# Patient Record
Sex: Female | Born: 2000 | Race: Black or African American | Hispanic: No | Marital: Single | Smoking: Never smoker
Health system: Southern US, Community
[De-identification: ages and names within clinical notes are randomized; demographics above are authoritative.]

## PROBLEM LIST (undated history)

## (undated) DIAGNOSIS — D649 Anemia, unspecified: Secondary | ICD-10-CM

## (undated) DIAGNOSIS — L309 Dermatitis, unspecified: Secondary | ICD-10-CM

## (undated) DIAGNOSIS — J302 Other seasonal allergic rhinitis: Secondary | ICD-10-CM

## (undated) DIAGNOSIS — Z8489 Family history of other specified conditions: Secondary | ICD-10-CM

---
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
## 2020-07-01 IMAGING — US US BREAST*R* LIMITED INC AXILLA
1 series · 4 of 4 positions shown · non-contrast
Comparison: Previous exam(s).

CLINICAL DATA: Follow-up for probably benign fibroadenoma in the
RIGHT breast.

EXAM:
ULTRASOUND OF THE RIGHT BREAST

[Series 1: us breast*right* limited inc axilla · 0.05mm/px · 4 of 4 slices shown]
[im 1/4]
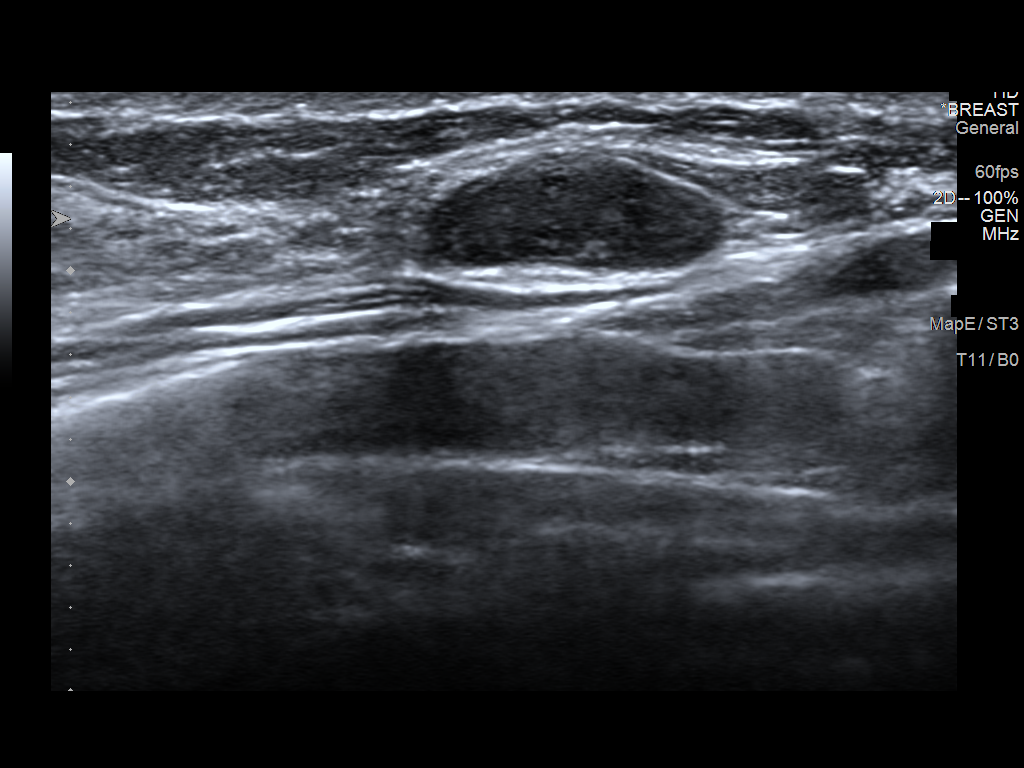
[im 2/4]
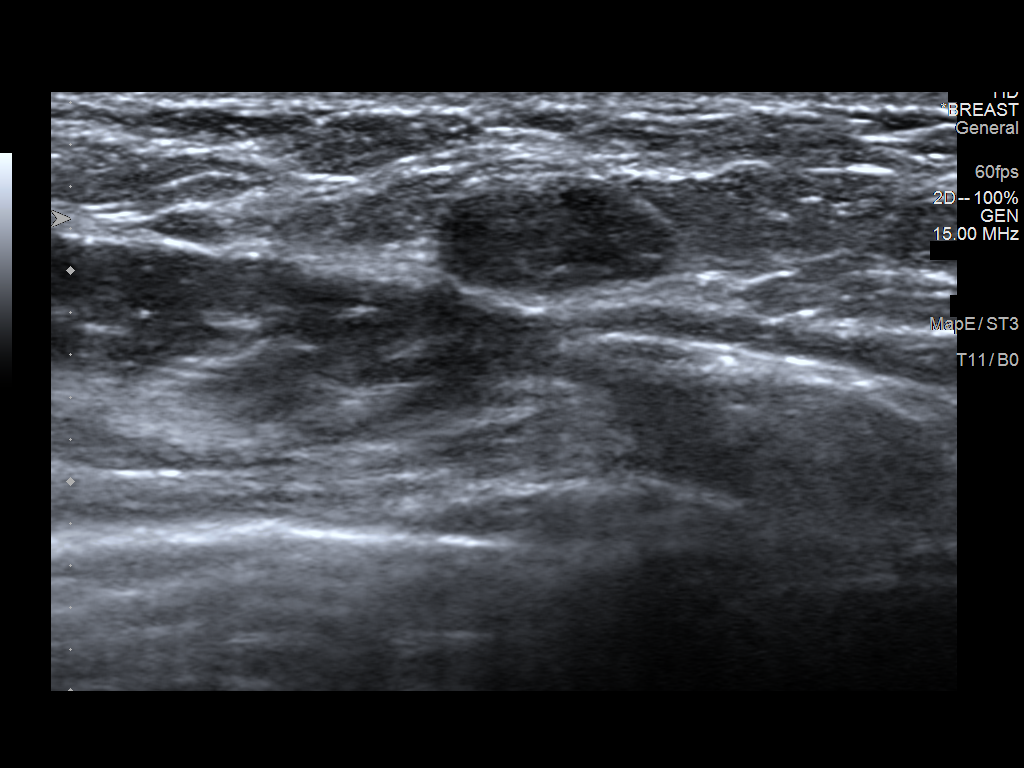
[im 3/4]
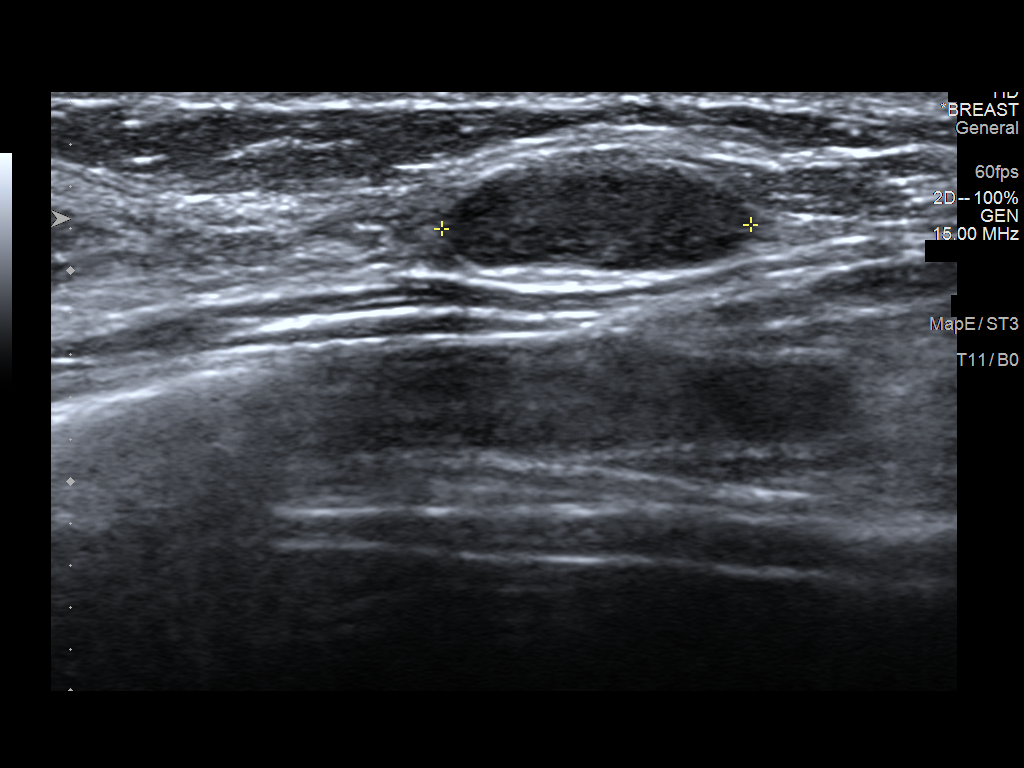
[im 4/4]
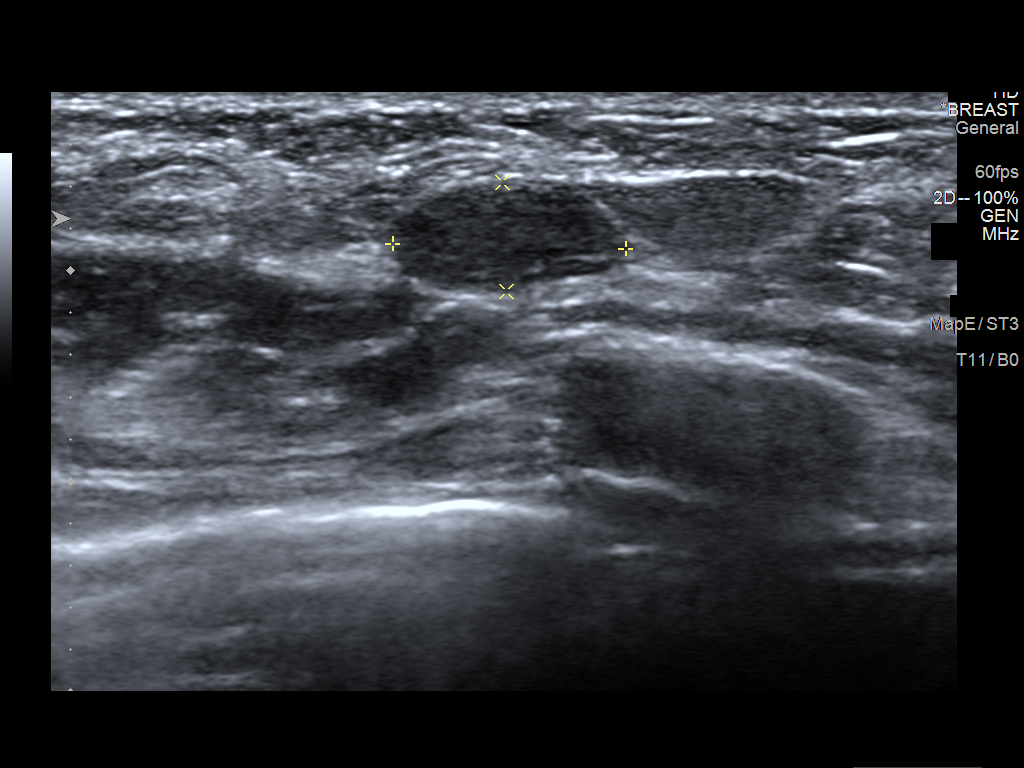

[4 of 4 positions shown; findings below may reference images not displayed]

FINDINGS: Targeted ultrasound is performed, again showing the oval
circumscribed hypoechoic mass in the RIGHT breast at the 3:30
o'clock axis, 7 cm from the nipple, measuring 1.5 x 0.5 x 1.1 cm,
stable or slightly smaller compared to previous exams, suggesting
benignity, most likely a benign fibroadenoma.
IMPRESSION: Probably benign fibroadenoma within the RIGHT breast at the 3:30
o'clock axis, 7 cm from the nipple, measuring 1.5 x 0.5 x 1.1 cm on
today's exam, stable or slightly smaller compared to previous exams
suggesting benignity.

Recommend additional follow-up RIGHT breast ultrasound in 12 months
to ensure 2 year stability.

RECOMMENDATION:
RIGHT breast ultrasound in 12 months.

I have discussed the findings and recommendations with the patient.
If applicable, a reminder letter will be sent to the patient
regarding the next appointment.

BI-RADS CATEGORY  3: Probably benign.

## 2022-12-18 ENCOUNTER — Ambulatory Visit (HOSPITAL_COMMUNITY): Payer: Self-pay

## 2023-01-12 ENCOUNTER — Ambulatory Visit (HOSPITAL_COMMUNITY): Payer: Self-pay

## 2023-04-01 ENCOUNTER — Emergency Department (HOSPITAL_COMMUNITY): Payer: Medicaid Other

## 2023-04-01 ENCOUNTER — Encounter (HOSPITAL_COMMUNITY): Payer: Self-pay | Admitting: Emergency Medicine

## 2023-04-01 ENCOUNTER — Other Ambulatory Visit: Payer: Self-pay

## 2023-04-01 ENCOUNTER — Observation Stay (HOSPITAL_COMMUNITY)
Admission: EM | Admit: 2023-04-01 | Discharge: 2023-04-03 | DRG: 200 | Disposition: A | Discharge status: Home Health | Payer: Medicaid Other | Attending: General Surgery | Admitting: General Surgery

## 2023-04-01 DIAGNOSIS — Y92410 Unspecified street and highway as the place of occurrence of the external cause: Secondary | ICD-10-CM | POA: Diagnosis not present

## 2023-04-01 DIAGNOSIS — R000 Tachycardia, unspecified: Secondary | ICD-10-CM | POA: Insufficient documentation

## 2023-04-01 DIAGNOSIS — D509 Iron deficiency anemia, unspecified: Secondary | ICD-10-CM | POA: Insufficient documentation

## 2023-04-01 DIAGNOSIS — S27322A Contusion of lung, bilateral, initial encounter: Secondary | ICD-10-CM | POA: Insufficient documentation

## 2023-04-01 DIAGNOSIS — S270XXA Traumatic pneumothorax, initial encounter: Secondary | ICD-10-CM | POA: Diagnosis not present

## 2023-04-01 DIAGNOSIS — S82899A Other fracture of unspecified lower leg, initial encounter for closed fracture: Secondary | ICD-10-CM | POA: Diagnosis present

## 2023-04-01 DIAGNOSIS — Y906 Blood alcohol level of 120-199 mg/100 ml: Secondary | ICD-10-CM | POA: Insufficient documentation

## 2023-04-01 DIAGNOSIS — N920 Excessive and frequent menstruation with regular cycle: Secondary | ICD-10-CM | POA: Diagnosis not present

## 2023-04-01 DIAGNOSIS — L309 Dermatitis, unspecified: Secondary | ICD-10-CM | POA: Insufficient documentation

## 2023-04-01 DIAGNOSIS — F10120 Alcohol abuse with intoxication, uncomplicated: Secondary | ICD-10-CM | POA: Diagnosis not present

## 2023-04-01 DIAGNOSIS — S92001A Unspecified fracture of right calcaneus, initial encounter for closed fracture: Secondary | ICD-10-CM | POA: Diagnosis not present

## 2023-04-01 DIAGNOSIS — S82841A Displaced bimalleolar fracture of right lower leg, initial encounter for closed fracture: Secondary | ICD-10-CM | POA: Insufficient documentation

## 2023-04-01 DIAGNOSIS — F10920 Alcohol use, unspecified with intoxication, uncomplicated: Secondary | ICD-10-CM

## 2023-04-01 LAB — CBC WITH DIFFERENTIAL/PLATELET
Abs Immature Granulocytes: 0.03 10*3/uL (ref 0.00–0.07)
Basophils Absolute: 0.1 10*3/uL (ref 0.0–0.1)
Basophils Relative: 1 %
Eosinophils Absolute: 0 10*3/uL (ref 0.0–0.5)
Eosinophils Relative: 0 %
HCT: 27.6 % — ABNORMAL LOW (ref 36.0–46.0)
Hemoglobin: 7.2 g/dL — ABNORMAL LOW (ref 12.0–15.0)
Immature Granulocytes: 0 %
Lymphocytes Relative: 16 %
Lymphs Abs: 1.3 10*3/uL (ref 0.7–4.0)
MCH: 16.6 pg — ABNORMAL LOW (ref 26.0–34.0)
MCHC: 26.1 g/dL — ABNORMAL LOW (ref 30.0–36.0)
MCV: 63.4 fL — ABNORMAL LOW (ref 80.0–100.0)
Monocytes Absolute: 0.5 10*3/uL (ref 0.1–1.0)
Monocytes Relative: 6 %
Neutro Abs: 6.4 10*3/uL (ref 1.7–7.7)
Neutrophils Relative %: 77 %
Platelets: 490 10*3/uL — ABNORMAL HIGH (ref 150–400)
RBC: 4.35 MIL/uL (ref 3.87–5.11)
RDW: 18.5 % — ABNORMAL HIGH (ref 11.5–15.5)
WBC: 8.3 10*3/uL (ref 4.0–10.5)
nRBC: 0 % (ref 0.0–0.2)

## 2023-04-01 LAB — BASIC METABOLIC PANEL
Anion gap: 10 (ref 5–15)
BUN: 7 mg/dL (ref 6–20)
CO2: 20 mmol/L — ABNORMAL LOW (ref 22–32)
Calcium: 8.2 mg/dL — ABNORMAL LOW (ref 8.9–10.3)
Chloride: 109 mmol/L (ref 98–111)
Creatinine, Ser: 0.68 mg/dL (ref 0.44–1.00)
GFR, Estimated: >60 mL/min (ref >60)
Glucose, Bld: 144 mg/dL — ABNORMAL HIGH (ref 70–99)
Potassium: 3.4 mmol/L — ABNORMAL LOW (ref 3.5–5.1)
Sodium: 139 mmol/L (ref 135–145)

## 2023-04-01 LAB — HIV ANTIBODY (ROUTINE TESTING W REFLEX): HIV Screen 4th Generation wRfx: NONREACTIVE

## 2023-04-01 LAB — HCG, SERUM, QUALITATIVE: Preg, Serum: NEGATIVE

## 2023-04-01 LAB — ETHANOL: Alcohol, Ethyl (B): 125 mg/dL — ABNORMAL HIGH (ref <10)

## 2023-04-01 MED ORDER — MORPHINE SULFATE (PF) 4 MG/ML IV SOLN
4.0000 mg | INTRAVENOUS | Status: DC | PRN
  Administered 2023-04-02: 4 mg via INTRAVENOUS
  Filled 2023-04-01: qty 1

## 2023-04-01 MED ORDER — HYDRALAZINE HCL 20 MG/ML IJ SOLN: 10.0000 mg | INTRAMUSCULAR | Status: DC | PRN

## 2023-04-01 MED ORDER — OXYCODONE-ACETAMINOPHEN 5-325 MG PO TABS
2.0000 | ORAL_TABLET | Freq: Once | ORAL | Status: AC
  Administered 2023-04-01: 2 via ORAL
  Filled 2023-04-01: qty 2

## 2023-04-01 MED ORDER — ONDANSETRON 4 MG PO TBDP: 4.0000 mg | ORAL_TABLET | Freq: Four times a day (QID) | ORAL | Status: DC | PRN

## 2023-04-01 MED ORDER — ONDANSETRON HCL 4 MG/2ML IJ SOLN
4.0000 mg | Freq: Four times a day (QID) | INTRAMUSCULAR | Status: DC | PRN
  Administered 2023-04-03: 4 mg via INTRAVENOUS
  Filled 2023-04-01: qty 2

## 2023-04-01 MED ORDER — OXYCODONE HCL 5 MG PO TABS
10.0000 mg | ORAL_TABLET | ORAL | Status: DC | PRN
  Administered 2023-04-01 – 2023-04-02 (×2): 10 mg via ORAL
  Filled 2023-04-01 (×2): qty 2

## 2023-04-01 MED ORDER — HYDROMORPHONE HCL 1 MG/ML IJ SOLN
0.5000 mg | Freq: Once | INTRAMUSCULAR | Status: AC
  Administered 2023-04-01: 0.5 mg via INTRAVENOUS
  Filled 2023-04-01: qty 1

## 2023-04-01 MED ORDER — OXYCODONE HCL 5 MG PO TABS
5.0000 mg | ORAL_TABLET | ORAL | Status: DC | PRN
  Administered 2023-04-01: 5 mg via ORAL
  Filled 2023-04-01 (×2): qty 1

## 2023-04-01 MED ORDER — DOCUSATE SODIUM 100 MG PO CAPS
100.0000 mg | ORAL_CAPSULE | Freq: Two times a day (BID) | ORAL | Status: DC
  Administered 2023-04-01 – 2023-04-03 (×4): 100 mg via ORAL
  Filled 2023-04-01 (×5): qty 1

## 2023-04-01 MED ORDER — POLYETHYLENE GLYCOL 3350 17 G PO PACK: 17.0000 g | PACK | Freq: Every day | ORAL | Status: DC | PRN

## 2023-04-01 MED ORDER — METHOCARBAMOL 500 MG PO TABS
500.0000 mg | ORAL_TABLET | Freq: Three times a day (TID) | ORAL | Status: DC
  Administered 2023-04-01 – 2023-04-03 (×8): 500 mg via ORAL
  Filled 2023-04-01 (×8): qty 1

## 2023-04-01 MED ORDER — IOHEXOL 350 MG/ML SOLN
75.0000 mL | Freq: Once | INTRAVENOUS | Status: AC | PRN
  Administered 2023-04-01: 75 mL via INTRAVENOUS

## 2023-04-01 MED ORDER — LACTATED RINGERS IV SOLN: INTRAVENOUS | Status: DC

## 2023-04-01 MED ORDER — METOPROLOL TARTRATE 5 MG/5ML IV SOLN: 5.0000 mg | Freq: Four times a day (QID) | INTRAVENOUS | Status: DC | PRN

## 2023-04-01 MED ORDER — ACETAMINOPHEN 500 MG PO TABS
1000.0000 mg | ORAL_TABLET | Freq: Four times a day (QID) | ORAL | Status: DC
  Administered 2023-04-01 – 2023-04-03 (×8): 1000 mg via ORAL
  Filled 2023-04-01 (×11): qty 2

## 2023-04-01 MED ORDER — ENOXAPARIN SODIUM 30 MG/0.3ML IJ SOSY
30.0000 mg | PREFILLED_SYRINGE | Freq: Two times a day (BID) | INTRAMUSCULAR | Status: DC
  Administered 2023-04-02: 30 mg via SUBCUTANEOUS
  Filled 2023-04-01: qty 0.3

## 2023-04-01 MED ORDER — KETOROLAC TROMETHAMINE 15 MG/ML IJ SOLN
15.0000 mg | Freq: Once | INTRAMUSCULAR | Status: AC
  Administered 2023-04-01: 15 mg via INTRAVENOUS
  Filled 2023-04-01: qty 1

## 2023-04-01 MED ORDER — METHOCARBAMOL 1000 MG/10ML IJ SOLN
500.0000 mg | Freq: Three times a day (TID) | INTRAVENOUS | Status: DC
  Filled 2023-04-01: qty 5
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
## 2023-04-01 NOTE — Progress Notes (Signed)
Orthopedic Tech Progress Note Patient Details:  Pamela Irwin Aug 06, 2001 [id number]  Ortho Devices Type of Ortho Device: Crutches Ortho Device/Splint Location: rle bulky jones dressing Ortho Device/Splint Interventions: Ordered, Adjustment   Post Interventions Patient Tolerated: Well Instructions Provided: Care of device, Adjustment of device Crutches left at bedside, as patient has not yet received PT evaluation yet. [place] A Ames Hoban 04/01/2023, 1:12 PM

## 2023-04-01 NOTE — Progress Notes (Signed)
Orthopedic Tech Progress Note Patient Details:  Pamela Irwin 2001-03-26 [id number]  Ortho Devices Type of Ortho Device: Stirrup splint Ortho Device/Splint Location: rle bulky jones dressing Ortho Device/Splint Interventions: Ordered, Application, Adjustment  I applied a short leg bulky jones with a stirrup on top. The patients RN held for me. Post Interventions Patient Tolerated: Well Instructions Provided: Care of device, Adjustment of device  Trinna Post 04/01/2023, 5:45 AM
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
## 2023-04-01 NOTE — Plan of Care (Signed)

## 2023-04-02 ENCOUNTER — Observation Stay (HOSPITAL_COMMUNITY): Payer: Medicaid Other

## 2023-04-02 LAB — CBC
HCT: 21.9 % — ABNORMAL LOW (ref 36.0–46.0)
Hemoglobin: 6.2 g/dL — CL (ref 12.0–15.0)
MCH: 18.1 pg — ABNORMAL LOW (ref 26.0–34.0)
MCHC: 28.3 g/dL — ABNORMAL LOW (ref 30.0–36.0)
MCV: 64 fL — ABNORMAL LOW (ref 80.0–100.0)
Platelets: 345 10*3/uL (ref 150–400)
RBC: 3.42 MIL/uL — ABNORMAL LOW (ref 3.87–5.11)
RDW: 18 % — ABNORMAL HIGH (ref 11.5–15.5)
WBC: 8.4 10*3/uL (ref 4.0–10.5)
nRBC: 0 % (ref 0.0–0.2)

## 2023-04-02 LAB — HEMOGLOBIN AND HEMATOCRIT, BLOOD
HCT: 26.3 % — ABNORMAL LOW (ref 36.0–46.0)
Hemoglobin: 7.4 g/dL — ABNORMAL LOW (ref 12.0–15.0)

## 2023-04-02 LAB — BASIC METABOLIC PANEL
Anion gap: 9 (ref 5–15)
BUN: 6 mg/dL (ref 6–20)
CO2: 25 mmol/L (ref 22–32)
Calcium: 8.7 mg/dL — ABNORMAL LOW (ref 8.9–10.3)
Chloride: 103 mmol/L (ref 98–111)
Creatinine, Ser: 0.76 mg/dL (ref 0.44–1.00)
GFR, Estimated: >60 mL/min (ref >60)
Glucose, Bld: 90 mg/dL (ref 70–99)
Potassium: 3.3 mmol/L — ABNORMAL LOW (ref 3.5–5.1)
Sodium: 137 mmol/L (ref 135–145)

## 2023-04-02 LAB — ABO/RH: ABO/RH(D): O POS

## 2023-04-02 LAB — PREPARE RBC (CROSSMATCH)

## 2023-04-02 LAB — TYPE AND SCREEN
Antibody Screen: NEGATIVE
Unit division: 0

## 2023-04-02 LAB — BPAM RBC: Unit Type and Rh: 5100

## 2023-04-02 MED ORDER — VITAMIN C 500 MG PO TABS
500.0000 mg | ORAL_TABLET | Freq: Two times a day (BID) | ORAL | Status: DC
  Administered 2023-04-02 – 2023-04-03 (×3): 500 mg via ORAL
  Filled 2023-04-02 (×3): qty 1

## 2023-04-02 MED ORDER — POTASSIUM CHLORIDE 20 MEQ PO PACK
40.0000 meq | PACK | Freq: Once | ORAL | Status: AC
  Administered 2023-04-02: 40 meq via ORAL
  Filled 2023-04-02: qty 2

## 2023-04-02 MED ORDER — DIPHENHYDRAMINE HCL 25 MG PO CAPS
25.0000 mg | ORAL_CAPSULE | Freq: Four times a day (QID) | ORAL | Status: DC | PRN
  Administered 2023-04-02: 25 mg via ORAL
  Filled 2023-04-02 (×2): qty 1

## 2023-04-02 MED ORDER — OXYCODONE HCL 5 MG PO TABS
5.0000 mg | ORAL_TABLET | ORAL | Status: DC | PRN
  Administered 2023-04-02 – 2023-04-03 (×2): 10 mg via ORAL
  Filled 2023-04-02 (×2): qty 2

## 2023-04-02 MED ORDER — FERROUS SULFATE 325 (65 FE) MG PO TABS
325.0000 mg | ORAL_TABLET | Freq: Every day | ORAL | Status: DC
  Administered 2023-04-03: 325 mg via ORAL
  Filled 2023-04-02: qty 1

## 2023-04-02 MED ORDER — SODIUM CHLORIDE 0.9% IV SOLUTION: Freq: Once | INTRAVENOUS | Status: AC

## 2023-04-02 NOTE — Plan of Care (Signed)
  Problem: Education: Goal: Knowledge of General Education information will improve Description: Including pain rating scale, medication(s)/side effects and non-pharmacologic comfort measures Outcome: Progressing   Problem: Clinical Measurements: Goal: Will remain free from infection Outcome: Progressing   Problem: Activity: Goal: Risk for activity intolerance will decrease Outcome: Progressing   Problem: Nutrition: Goal: Adequate nutrition will be maintained Outcome: Progressing   Problem: Elimination: Goal: Will not experience complications related to bowel motility Outcome: Progressing   Problem: Pain Managment: Goal: General experience of comfort will improve Outcome: Progressing   
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
## 2023-04-02 NOTE — Progress Notes (Addendum)
Date and time results received: 04/02/23 5:47 AM  Test: hgb & potassium  Critical Value: hgb 6.4  K 3.3  Name of Provider Notified: Dr. Janee Morn Trauma team  Orders Received?  None Or Actions Taken?: None. Dr Janee Morn will be seeing pt at bedside to discuss further treatment
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
## 2023-04-02 NOTE — TOC CAGE-AID Note (Signed)
Transition of Care [facility]) - CAGE-AID Screening   Patient Details  Name: Pamela Irwin MRN: [id number] Date of Birth: 2000/09/23  [facility] of Care [facility]) CM/SW Contact:    Katha Hamming, RN Phone Number: 04/02/2023, 9:57 PM   CAGE-AID Screening:    Have You Ever Felt You Ought to Cut Down on Your Drinking or Drug Use?: No Have People Annoyed You By [employer] Your Drinking Or Drug Use?: No Have You Felt Bad Or Guilty About Your Drinking Or Drug Use?: No Have You Ever Had a Drink or Used Drugs First Thing In The Morning to Steady Your Nerves or to Get Rid of a Hangover?: No CAGE-AID Score: 0  Substance Abuse Education Offered: No (no resources indicated)
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
## 2023-04-03 ENCOUNTER — Other Ambulatory Visit (HOSPITAL_COMMUNITY): Payer: Self-pay

## 2023-04-03 DIAGNOSIS — D62 Acute posthemorrhagic anemia: Secondary | ICD-10-CM | POA: Diagnosis not present

## 2023-04-03 DIAGNOSIS — F10120 Alcohol abuse with intoxication, uncomplicated: Secondary | ICD-10-CM | POA: Diagnosis present

## 2023-04-03 DIAGNOSIS — Y92410 Unspecified street and highway as the place of occurrence of the external cause: Secondary | ICD-10-CM | POA: Diagnosis not present

## 2023-04-03 DIAGNOSIS — L309 Dermatitis, unspecified: Secondary | ICD-10-CM | POA: Diagnosis present

## 2023-04-03 DIAGNOSIS — N920 Excessive and frequent menstruation with regular cycle: Secondary | ICD-10-CM | POA: Diagnosis present

## 2023-04-03 DIAGNOSIS — S92055A Nondisplaced other extraarticular fracture of left calcaneus, initial encounter for closed fracture: Secondary | ICD-10-CM | POA: Diagnosis not present

## 2023-04-03 DIAGNOSIS — S270XXA Traumatic pneumothorax, initial encounter: Secondary | ICD-10-CM | POA: Diagnosis not present

## 2023-04-03 DIAGNOSIS — S92001A Unspecified fracture of right calcaneus, initial encounter for closed fracture: Secondary | ICD-10-CM | POA: Diagnosis present

## 2023-04-03 DIAGNOSIS — S82892A Other fracture of left lower leg, initial encounter for closed fracture: Secondary | ICD-10-CM | POA: Diagnosis not present

## 2023-04-03 DIAGNOSIS — Y906 Blood alcohol level of 120-199 mg/100 ml: Secondary | ICD-10-CM | POA: Diagnosis present

## 2023-04-03 DIAGNOSIS — S82841A Displaced bimalleolar fracture of right lower leg, initial encounter for closed fracture: Secondary | ICD-10-CM | POA: Diagnosis present

## 2023-04-03 DIAGNOSIS — S27322A Contusion of lung, bilateral, initial encounter: Secondary | ICD-10-CM | POA: Diagnosis not present

## 2023-04-03 DIAGNOSIS — D509 Iron deficiency anemia, unspecified: Secondary | ICD-10-CM | POA: Diagnosis present

## 2023-04-03 DIAGNOSIS — R000 Tachycardia, unspecified: Secondary | ICD-10-CM | POA: Diagnosis present

## 2023-04-03 LAB — CBC
HCT: 26.4 % — ABNORMAL LOW (ref 36.0–46.0)
Hemoglobin: 7.3 g/dL — ABNORMAL LOW (ref 12.0–15.0)
MCH: 18.5 pg — ABNORMAL LOW (ref 26.0–34.0)
MCHC: 27.7 g/dL — ABNORMAL LOW (ref 30.0–36.0)
MCV: 67 fL — ABNORMAL LOW (ref 80.0–100.0)
Platelets: 337 10*3/uL (ref 150–400)
RBC: 3.94 MIL/uL (ref 3.87–5.11)
RDW: 20.6 % — ABNORMAL HIGH (ref 11.5–15.5)
WBC: 7.7 10*3/uL (ref 4.0–10.5)
nRBC: 0 % (ref 0.0–0.2)

## 2023-04-03 LAB — BPAM RBC
Blood Product Expiration Date: 202408122359
ISSUE DATE / TIME: 202407151445

## 2023-04-03 LAB — TYPE AND SCREEN: ABO/RH(D): O POS

## 2023-04-03 MED ORDER — OXYCODONE HCL 5 MG PO TABS
5.0000 mg | ORAL_TABLET | Freq: Four times a day (QID) | ORAL | 0 refills | Status: AC | PRN
  Filled 2023-04-03: qty 20, 5d supply, fill #0

## 2023-04-03 MED ORDER — METHOCARBAMOL 500 MG PO TABS
500.0000 mg | ORAL_TABLET | Freq: Three times a day (TID) | ORAL | 0 refills | Status: AC
Start: 2023-04-03 — End: ?
  Filled 2023-04-03: qty 30, 10d supply, fill #0

## 2023-04-03 MED ORDER — ACETAMINOPHEN 500 MG PO TABS
1000.0000 mg | ORAL_TABLET | Freq: Four times a day (QID) | ORAL | 0 refills | Status: AC
  Filled 2023-04-03: qty 100, 13d supply, fill #0

## 2023-04-03 MED ORDER — FERROUS SULFATE 325 (65 FE) MG PO TABS: 325.0000 mg | ORAL_TABLET | Freq: Every day | ORAL | Status: AC

## 2023-04-03 MED ORDER — POLYETHYLENE GLYCOL 3350 17 G PO PACK: 17.0000 g | PACK | Freq: Every day | ORAL | Status: AC | PRN

## 2023-04-03 MED ORDER — DOCUSATE SODIUM 100 MG PO CAPS
100.0000 mg | ORAL_CAPSULE | Freq: Two times a day (BID) | ORAL | 0 refills | Status: AC
  Filled 2023-04-03: qty 100, 50d supply, fill #0

## 2023-04-03 NOTE — Progress Notes (Signed)
Delivered TOC meds to patient's RN. In his care.

## 2023-04-03 NOTE — TOC Transition Note (Addendum)
Transition of Care [facility]) - CM/SW Discharge Note   Patient Details  Name: Pamela Irwin MRN: [id number] Date of Birth: 2001/06/18  Transition of Care [facility]) CM/SW Contact:  Epifanio Lesches, RN Phone Number: 04/03/2023, 11:23 AM   Clinical Narrative:    Patient will DC to: home Anticipated DC date: 04/01/2023 Family notified: yes Transport by: car   Per MD patient ready for DC today pending therapy clearance. RN, patient, patient's family, and [facility]  notified of DC.   Post hospital f/u noted on AVS. DME RW already delivered to bedside for home. RX meds will be delivered to bedside prior to d/c. Mom to provide transportation to home.  RNCM will sign off for now as intervention is no longer needed. Please consult [place] again if new needs arise.   7/16 Medicaid screening requested by NCM to Christia Reading /F.C. via email.  Final next level of care: Home w Home Health Services Barriers to Discharge: No Barriers Identified   Patient Goals and CMS Choice      Discharge Placement                         Discharge Plan and Services Additional resources added to the After Visit Summary for                                       Social Determinants of Health (SDOH) Interventions SDOH Screenings   Food Insecurity: No Food Insecurity (04/01/2023)  Housing: Low Risk  (04/01/2023)  Transportation Needs: No Transportation Needs (04/01/2023)  Utilities: Not At Risk (04/01/2023)  Tobacco Use: Low Risk  (04/01/2023)     Readmission Risk Interventions     No data to display
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
## 2023-04-03 NOTE — Progress Notes (Signed)
Discharge instructions given. Patient verbalized understanding and all questions were answered.  ?

## 2023-04-03 NOTE — Discharge Instructions (Signed)
Please maintain your splint and non-weight bearing to the right lower extremity. Follow up with Dr. Odis Hollingshead later this week or early next.

## 2023-04-03 NOTE — Plan of Care (Signed)

## 2023-04-03 NOTE — Plan of Care (Signed)
°  Problem: Education: Goal: Knowledge of General Education information will improve Description: Including pain rating scale, medication(s)/side effects and non-pharmacologic comfort measures Outcome: Adequate for Discharge   Problem: Clinical Measurements: Goal: Ability to maintain clinical measurements within normal limits will improve Outcome: Adequate for Discharge   Problem: Activity: Goal: Risk for activity intolerance will decrease Outcome: Adequate for Discharge   Problem: Nutrition: Goal: Adequate nutrition will be maintained Outcome: Adequate for Discharge   Problem: Pain Managment: Goal: General experience of comfort will improve Outcome: Adequate for Discharge
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
## 2023-04-03 NOTE — Progress Notes (Signed)
Mobility Specialist Progress Note:   04/03/23 1004  Mobility  Activity Ambulated with assistance in hallway  Level of Assistance Contact guard assist, steadying assist  Assistive Device Front wheel walker  Distance Ambulated (ft) 150 ft  RLE Weight Bearing NWB  Activity Response Tolerated well  Mobility Referral Yes  $Mobility charge 1 Mobility  Mobility Specialist Start Time (ACUTE ONLY) 0950  Mobility Specialist Stop Time (ACUTE ONLY) 1002  Mobility Specialist Time Calculation (min) (ACUTE ONLY) 12 min   Pt received in bed, agreeable to ambulate. Pt needed no physical assistance during session. She was able to understand the NWB precautions for her LLE and practiced a great hop-to gait. OT entered at EOS. Pt left w/ OT all needs met.  Thompson Grayer Mobility Specialist  Please contact vis Secure Chat or  Rehab Office [phone]

## 2023-04-12 DIAGNOSIS — S82841A Displaced bimalleolar fracture of right lower leg, initial encounter for closed fracture: Secondary | ICD-10-CM | POA: Diagnosis not present

## 2023-04-12 DIAGNOSIS — S92001A Unspecified fracture of right calcaneus, initial encounter for closed fracture: Secondary | ICD-10-CM | POA: Diagnosis not present

## 2023-04-16 ENCOUNTER — Encounter (HOSPITAL_BASED_OUTPATIENT_CLINIC_OR_DEPARTMENT_OTHER): Payer: Self-pay | Admitting: Orthopaedic Surgery

## 2023-04-16 NOTE — Progress Notes (Signed)
04/16/2023 12:24 PM Spoke w/ via phone for pre-op interview---patient  Lab needs dos----  Urine Pregnancy Lab results------in EPIC COVID test -----patient states asymptomatic no test needed Arrive at -------0900 NPO after MN NO Solid Food.  Clear liquids from MN until---0700 Med rec completed Medications to take morning of surgery -----none Diabetic medication -----na Patient instructed no nail polish to be worn day of surgery Patient instructed to bring photo id and insurance card day of surgery Patient aware to have Driver (ride ) / caregiver    for 24 hours after surgery (home with mother and father) Patient Special Instructions -----none Pre-Op special Instructions -----Patient is non-weight bearing right leg, using crutches.  Patient verbalized understanding of instructions that were given at this phone interview. Patient denies shortness of breath, chest pain, fever, cough at this phone interview.  Dontavious Emily, Blanchard Kelch

## 2023-04-17 ENCOUNTER — Other Ambulatory Visit: Payer: Self-pay

## 2023-04-17 ENCOUNTER — Encounter (HOSPITAL_BASED_OUTPATIENT_CLINIC_OR_DEPARTMENT_OTHER): Payer: Self-pay | Admitting: Orthopaedic Surgery

## 2023-04-17 NOTE — H&P (Signed)
ORTHOPAEDIC SURGERY H&P  Subjective:  The patient presents with right ankle fx.   Past Medical History:  Diagnosis Date   Anemia    Eczema    Family history of adverse reaction to anesthesia    states her mother is hard to wake up after surgery   Seasonal allergies     History reviewed. No pertinent surgical history.   (Not in an outpatient encounter)    No Known Allergies  Social History   Socioeconomic History   Marital status: Single    Spouse name: Not on file   Number of children: Not on file   Years of education: Not on file   Highest education level: Not on file  Occupational History   Not on file  Tobacco Use   Smoking status: Never   Smokeless tobacco: Never  Vaping Use   Vaping status: Never Used  Substance and Sexual Activity   Alcohol use: Yes    Comment: occasional/socially   Drug use: Never   Sexual activity: Not on file  Other Topics Concern   Not on file  Social History Narrative   Not on file   Social Determinants of Health   Financial Resource Strain: Not on file  Food Insecurity: No Food Insecurity (04/01/2023)   Hunger Vital Sign    Worried About Running Out of Food in the Last Year: Never true    Ran Out of Food in the Last Year: Never true  Transportation Needs: No Transportation Needs (04/01/2023)   PRAPARE - Administrator, Civil Service (Medical): No    Lack of Transportation (Non-Medical): No  Physical Activity: Not on file  Stress: Not on file  Social Connections: Not on file  Intimate Partner Violence: Not At Risk (04/01/2023)   Humiliation, Afraid, Rape, and Kick questionnaire    Fear of Current or Ex-Partner: No    Emotionally Abused: No    Physically Abused: No    Sexually Abused: No     History reviewed. No pertinent family history.   Review of Systems Pertinent items are noted in HPI.  Objective: Vital signs in last 24 hours:    04/03/2023    7:57 AM 04/03/2023    5:10 AM 04/02/2023    8:47 PM   Vitals with BMI  Systolic 125 128 621  Diastolic 72 77 75  Pulse 80 81 91      EXAM: General: Well nourished, well developed. Awake, alert and oriented to time, place, person. Normal mood and affect. No apparent distress. Breathing room air.  Operative Lower Extremity: Alignment - Neutral Deformity - None Skin intact Tenderness to palpation - right ankle 5/5 TA, PT, GS, Per, EHL, FHL Sensation intact to light touch throughout Palpable DP and PT pulses Special testing: None  The contralateral foot/ankle was examined for comparison and noted to be neurovascularly intact with no localized deformity, swelling, or tenderness.  Imaging Review All images taken were independently reviewed by me.  Assessment/Plan: The clinical and radiographic findings were reviewed and discussed at length with the patient.  The patient has right ankle fx.  We spoke at length about the natural course of these findings. We discussed nonoperative and operative treatment options in detail.  The risks and benefits were presented and reviewed. The risks due to implant failure/irritation, infection, stiffness, nerve/vessel/tendon injury, wound healing issues, failure of this surgery, need for further surgery, thromboembolic events, amputation, death among others were discussed. The patient acknowledged the explanation and agreed to proceed  with the plan.  Netta Cedars  Orthopaedic Surgery [facility]

## 2023-04-17 NOTE — Discharge Instructions (Signed)

## 2023-04-17 NOTE — Progress Notes (Signed)
Reviewed chart with Dr. Tacy Dura. Ok to proceed with surgery as planned at [facility] 04/18/23.

## 2023-04-18 ENCOUNTER — Other Ambulatory Visit: Payer: Self-pay

## 2023-04-18 ENCOUNTER — Encounter (HOSPITAL_BASED_OUTPATIENT_CLINIC_OR_DEPARTMENT_OTHER): Payer: Self-pay | Admitting: Orthopaedic Surgery

## 2023-04-18 ENCOUNTER — Ambulatory Visit (HOSPITAL_COMMUNITY): Payer: Medicaid Other

## 2023-04-18 ENCOUNTER — Ambulatory Visit (HOSPITAL_BASED_OUTPATIENT_CLINIC_OR_DEPARTMENT_OTHER)
Admission: RE | Admit: 2023-04-18 | Discharge: 2023-04-18 | Disposition: A | Discharge status: Home / Self Care | Payer: Medicaid Other | Attending: Orthopaedic Surgery | Admitting: Orthopaedic Surgery

## 2023-04-18 ENCOUNTER — Encounter (HOSPITAL_BASED_OUTPATIENT_CLINIC_OR_DEPARTMENT_OTHER)
Admission: RE | Disposition: A | Discharge status: Home / Self Care | Payer: Self-pay | Source: Home / Self Care | Attending: Orthopaedic Surgery

## 2023-04-18 ENCOUNTER — Ambulatory Visit (HOSPITAL_BASED_OUTPATIENT_CLINIC_OR_DEPARTMENT_OTHER): Payer: Medicaid Other | Admitting: Anesthesiology

## 2023-04-18 ENCOUNTER — Ambulatory Visit (HOSPITAL_BASED_OUTPATIENT_CLINIC_OR_DEPARTMENT_OTHER): Payer: Self-pay | Admitting: Anesthesiology

## 2023-04-18 DIAGNOSIS — S82891K Other fracture of right lower leg, subsequent encounter for closed fracture with nonunion: Secondary | ICD-10-CM

## 2023-04-18 DIAGNOSIS — S82841A Displaced bimalleolar fracture of right lower leg, initial encounter for closed fracture: Secondary | ICD-10-CM

## 2023-04-18 DIAGNOSIS — S92001A Unspecified fracture of right calcaneus, initial encounter for closed fracture: Secondary | ICD-10-CM | POA: Diagnosis not present

## 2023-04-18 DIAGNOSIS — X58XXXA Exposure to other specified factors, initial encounter: Secondary | ICD-10-CM | POA: Insufficient documentation

## 2023-04-18 DIAGNOSIS — S82871A Displaced pilon fracture of right tibia, initial encounter for closed fracture: Secondary | ICD-10-CM | POA: Insufficient documentation

## 2023-04-18 DIAGNOSIS — S8261XA Displaced fracture of lateral malleolus of right fibula, initial encounter for closed fracture: Secondary | ICD-10-CM | POA: Diagnosis not present

## 2023-04-18 DIAGNOSIS — S8251XA Displaced fracture of medial malleolus of right tibia, initial encounter for closed fracture: Secondary | ICD-10-CM | POA: Diagnosis not present

## 2023-04-18 DIAGNOSIS — Z01818 Encounter for other preprocedural examination: Secondary | ICD-10-CM

## 2023-04-18 HISTORY — PX: ORIF ANKLE FRACTURE: SHX5408

## 2023-04-18 HISTORY — DX: Anemia, unspecified: D64.9

## 2023-04-18 HISTORY — DX: Family history of other specified conditions: Z84.89

## 2023-04-18 HISTORY — PX: ANKLE CLOSED REDUCTION: SHX880

## 2023-04-18 LAB — CBC
HCT: 36.4 % (ref 36.0–46.0)
Hemoglobin: 10.1 g/dL — ABNORMAL LOW (ref 12.0–15.0)
MCH: 20.7 pg — ABNORMAL LOW (ref 26.0–34.0)
MCHC: 27.7 g/dL — ABNORMAL LOW (ref 30.0–36.0)
MCV: 74.4 fL — ABNORMAL LOW (ref 80.0–100.0)
Platelets: 433 10*3/uL — ABNORMAL HIGH (ref 150–400)
RBC: 4.89 MIL/uL (ref 3.87–5.11)
RDW: 28.5 % — ABNORMAL HIGH (ref 11.5–15.5)
WBC: 5.3 10*3/uL (ref 4.0–10.5)
nRBC: 0 % (ref 0.0–0.2)

## 2023-04-18 LAB — TYPE AND SCREEN
ABO/RH(D): O POS
Antibody Screen: NEGATIVE

## 2023-04-18 LAB — POCT PREGNANCY, URINE: Preg Test, Ur: NEGATIVE

## 2023-04-18 SURGERY — OPEN REDUCTION INTERNAL FIXATION (ORIF) ANKLE FRACTURE
Anesthesia: Regional | Site: Foot | Laterality: Right

## 2023-04-18 MED ORDER — MIDAZOLAM HCL 2 MG/2ML IJ SOLN
2.0000 mg | Freq: Once | INTRAMUSCULAR | Status: AC
  Administered 2023-04-18: 2 mg via INTRAVENOUS

## 2023-04-18 MED ORDER — CEFAZOLIN SODIUM-DEXTROSE 2-4 GM/100ML-% IV SOLN
2.0000 g | INTRAVENOUS | Status: AC
  Administered 2023-04-18: 2 g via INTRAVENOUS

## 2023-04-18 MED ORDER — OXYCODONE HCL 5 MG/5ML PO SOLN: 5.0000 mg | Freq: Once | ORAL | Status: DC | PRN

## 2023-04-18 MED ORDER — PROMETHAZINE HCL 25 MG/ML IJ SOLN: 6.2500 mg | INTRAMUSCULAR | Status: DC | PRN

## 2023-04-18 MED ORDER — PROPOFOL 10 MG/ML IV BOLUS
INTRAVENOUS | Status: AC
  Filled 2023-04-18: qty 20

## 2023-04-18 MED ORDER — ONDANSETRON HCL 4 MG/2ML IJ SOLN
INTRAMUSCULAR | Status: DC | PRN
  Administered 2023-04-18: 4 mg via INTRAVENOUS

## 2023-04-18 MED ORDER — AMISULPRIDE (ANTIEMETIC) 5 MG/2ML IV SOLN: 10.0000 mg | Freq: Once | INTRAVENOUS | Status: DC | PRN

## 2023-04-18 MED ORDER — FENTANYL CITRATE (PF) 100 MCG/2ML IJ SOLN
INTRAMUSCULAR | Status: DC | PRN
  Administered 2023-04-18: 25 ug via INTRAVENOUS

## 2023-04-18 MED ORDER — DEXAMETHASONE SODIUM PHOSPHATE 10 MG/ML IJ SOLN
INTRAMUSCULAR | Status: AC
  Filled 2023-04-18: qty 1

## 2023-04-18 MED ORDER — LIDOCAINE 2% (20 MG/ML) 5 ML SYRINGE
INTRAMUSCULAR | Status: AC
  Filled 2023-04-18: qty 5

## 2023-04-18 MED ORDER — FENTANYL CITRATE (PF) 100 MCG/2ML IJ SOLN
100.0000 ug | Freq: Once | INTRAMUSCULAR | Status: AC
  Administered 2023-04-18: 100 ug via INTRAVENOUS

## 2023-04-18 MED ORDER — VANCOMYCIN HCL 500 MG IV SOLR
INTRAVENOUS | Status: DC | PRN
  Administered 2023-04-18: 500 mg

## 2023-04-18 MED ORDER — FENTANYL CITRATE (PF) 100 MCG/2ML IJ SOLN
INTRAMUSCULAR | Status: AC
  Filled 2023-04-18: qty 2

## 2023-04-18 MED ORDER — ACETAMINOPHEN 500 MG PO TABS
ORAL_TABLET | ORAL | Status: AC
  Filled 2023-04-18: qty 2

## 2023-04-18 MED ORDER — MIDAZOLAM HCL 2 MG/2ML IJ SOLN
INTRAMUSCULAR | Status: AC
  Filled 2023-04-18: qty 2

## 2023-04-18 MED ORDER — BUPIVACAINE HCL (PF) 0.5 % IJ SOLN
INTRAMUSCULAR | Status: DC | PRN
  Administered 2023-04-18: 30 mL via PERINEURAL

## 2023-04-18 MED ORDER — CHLORHEXIDINE GLUCONATE 4 % EX SOLN: 60.0000 mL | Freq: Once | CUTANEOUS | Status: DC

## 2023-04-18 MED ORDER — CEFAZOLIN SODIUM-DEXTROSE 2-4 GM/100ML-% IV SOLN
INTRAVENOUS | Status: AC
  Filled 2023-04-18: qty 100

## 2023-04-18 MED ORDER — FENTANYL CITRATE (PF) 100 MCG/2ML IJ SOLN: 25.0000 ug | INTRAMUSCULAR | Status: DC | PRN

## 2023-04-18 MED ORDER — PROPOFOL 10 MG/ML IV BOLUS
INTRAVENOUS | Status: DC | PRN
  Administered 2023-04-18: 200 mg via INTRAVENOUS

## 2023-04-18 MED ORDER — DEXAMETHASONE SODIUM PHOSPHATE 10 MG/ML IJ SOLN
INTRAMUSCULAR | Status: DC | PRN
  Administered 2023-04-18: 10 mg via INTRAVENOUS

## 2023-04-18 MED ORDER — LACTATED RINGERS IV SOLN: INTRAVENOUS | Status: DC

## 2023-04-18 MED ORDER — ONDANSETRON HCL 4 MG/2ML IJ SOLN
INTRAMUSCULAR | Status: AC
  Filled 2023-04-18: qty 2

## 2023-04-18 MED ORDER — ACETAMINOPHEN 500 MG PO TABS
1000.0000 mg | ORAL_TABLET | Freq: Once | ORAL | Status: AC
  Administered 2023-04-18: 1000 mg via ORAL

## 2023-04-18 MED ORDER — BUPIVACAINE HCL (PF) 0.25 % IJ SOLN
INTRAMUSCULAR | Status: DC | PRN
  Administered 2023-04-18: 20 mL via PERINEURAL

## 2023-04-18 MED ORDER — VANCOMYCIN HCL 500 MG IV SOLR
INTRAVENOUS | Status: AC
  Filled 2023-04-18: qty 10

## 2023-04-18 MED ORDER — OXYCODONE HCL 5 MG PO TABS: 5.0000 mg | ORAL_TABLET | Freq: Once | ORAL | Status: DC | PRN

## 2023-04-18 MED ORDER — LIDOCAINE 2% (20 MG/ML) 5 ML SYRINGE
INTRAMUSCULAR | Status: DC | PRN
  Administered 2023-04-18: 40 mg via INTRAVENOUS

## 2023-04-18 MED ORDER — MIDAZOLAM HCL 5 MG/5ML IJ SOLN
INTRAMUSCULAR | Status: DC | PRN
  Administered 2023-04-18: 2 mg via INTRAVENOUS

## 2023-04-18 SURGICAL SUPPLY — 77 items
APL PRP STRL LF DISP 70% ISPRP (MISCELLANEOUS) ×6
BANDAGE ESMARK 6X9 LF (GAUZE/BANDAGES/DRESSINGS) ×3 IMPLANT
BIT DRILL 2.4X140 LONG SOLID (BIT) ×1 IMPLANT
BLADE SURG 15 STRL LF DISP TIS (BLADE) ×12 IMPLANT
BLADE SURG 15 STRL SS (BLADE) ×12
BNDG CMPR 5X4 CHSV STRCH STRL (GAUZE/BANDAGES/DRESSINGS) ×3
BNDG CMPR 5X4 KNIT ELC UNQ LF (GAUZE/BANDAGES/DRESSINGS)
BNDG CMPR 6 X 5 YARDS HK CLSR (GAUZE/BANDAGES/DRESSINGS)
BNDG CMPR 9X6 STRL LF SNTH (GAUZE/BANDAGES/DRESSINGS) ×3
BNDG CMPR MED 10X6 ELC LF (GAUZE/BANDAGES/DRESSINGS) ×3
BNDG COHESIVE 4X5 TAN STRL LF (GAUZE/BANDAGES/DRESSINGS) ×3 IMPLANT
BNDG ELASTIC 4INX 5YD STR LF (GAUZE/BANDAGES/DRESSINGS) IMPLANT
BNDG ELASTIC 6INX 5YD STR LF (GAUZE/BANDAGES/DRESSINGS) IMPLANT
BNDG ELASTIC 6X10 VLCR STRL LF (GAUZE/BANDAGES/DRESSINGS) ×3 IMPLANT
BNDG ESMARK 6X9 LF (GAUZE/BANDAGES/DRESSINGS) ×3
BNDG GAUZE DERMACEA FLUFF 4 (GAUZE/BANDAGES/DRESSINGS) ×3 IMPLANT
BNDG GZE DERMACEA 4 6PLY (GAUZE/BANDAGES/DRESSINGS) ×3
BRUSH SCRUB EZ 4% CHG (MISCELLANEOUS) ×3 IMPLANT
CANISTER SUCT 1200ML W/VALVE (MISCELLANEOUS) ×3 IMPLANT
CHLORAPREP W/TINT 26 (MISCELLANEOUS) ×6 IMPLANT
COVER BACK TABLE 60X90IN (DRAPES) ×3 IMPLANT
CUFF TOURN SGL QUICK 34 (TOURNIQUET CUFF) ×3
CUFF TRNQT CYL 34X4.125X (TOURNIQUET CUFF) ×3 IMPLANT
DRAPE C-ARM 42X72 X-RAY (DRAPES) ×3 IMPLANT
DRAPE C-ARMOR (DRAPES) ×3 IMPLANT
DRAPE EXTREMITY T 121X128X90 (DISPOSABLE) ×3 IMPLANT
DRAPE IMP U-DRAPE 54X76 (DRAPES) ×3 IMPLANT
DRAPE U-SHAPE 47X51 STRL (DRAPES) ×3 IMPLANT
DRSG MEPITEL 4X7.2 (GAUZE/BANDAGES/DRESSINGS) ×3 IMPLANT
ELECT REM PT RETURN 9FT ADLT (ELECTROSURGICAL) ×3
ELECTRODE REM PT RTRN 9FT ADLT (ELECTROSURGICAL) ×3 IMPLANT
GAUZE PAD ABD 8X10 STRL (GAUZE/BANDAGES/DRESSINGS) ×15 IMPLANT
GAUZE SPONGE 4X4 12PLY STRL (GAUZE/BANDAGES/DRESSINGS) ×3 IMPLANT
GLOVE BIOGEL PI IND STRL 8 (GLOVE) ×3 IMPLANT
GLOVE SURG SS PI 7.5 STRL IVOR (GLOVE) ×6 IMPLANT
GOWN STRL REUS W/ TWL LRG LVL3 (GOWN DISPOSABLE) ×6 IMPLANT
GOWN STRL REUS W/TWL LRG LVL3 (GOWN DISPOSABLE) ×6
MARKER SKIN DUAL TIP RULER LAB (MISCELLANEOUS) IMPLANT
NDL HYPO 25X1 1.5 SAFETY (NEEDLE) IMPLANT
NEEDLE HYPO 25X1 1.5 SAFETY (NEEDLE) IMPLANT
NS IRRIG 1000ML POUR BTL (IV SOLUTION) ×3 IMPLANT
PACK BASIN DAY SURGERY FS (CUSTOM PROCEDURE TRAY) ×3 IMPLANT
PAD CAST 4YDX4 CTTN HI CHSV (CAST SUPPLIES) ×3 IMPLANT
PADDING CAST ABS COTTON 4X4 ST (CAST SUPPLIES) IMPLANT
PADDING CAST COTTON 4X4 STRL (CAST SUPPLIES) ×3
PADDING CAST SYNTHETIC 4X4 STR (CAST SUPPLIES) ×6 IMPLANT
PADDING CAST SYNTHETIC 6X4 NS (CAST SUPPLIES) ×6 IMPLANT
PENCIL SMOKE EVACUATOR (MISCELLANEOUS) ×3 IMPLANT
PLATE MEDIAL MALLEOLUS 4H HOOK (Plate) ×1 IMPLANT
SCREW LOCK PLATE R3 3.5X16 (Screw) ×1 IMPLANT
SCREW LOCK PLATE R3 3.5X30 (Screw) ×1 IMPLANT
SCREW RE3CON NL 3.5X30 (Screw) ×1 IMPLANT
SHEET MEDIUM DRAPE 40X70 STRL (DRAPES) ×3 IMPLANT
SLEEVE SCD COMPRESS KNEE MED (STOCKING) ×3 IMPLANT
SPIKE FLUID TRANSFER (MISCELLANEOUS) IMPLANT
SPLINT PLASTER CAST FAST 5X30 (CAST SUPPLIES) ×60 IMPLANT
SPONGE T-LAP 18X18 ~~LOC~~+RFID (SPONGE) ×3 IMPLANT
STAPLER VISISTAT 35W (STAPLE) IMPLANT
STOCKINETTE 6 STRL (DRAPES) ×3 IMPLANT
STOCKINETTE ORTHO 6X25 (MISCELLANEOUS) ×3 IMPLANT
SUCTION TUBE FRAZIER 10FR DISP (SUCTIONS) IMPLANT
SUT ETHILON 2 0 FS 18 (SUTURE) ×5 IMPLANT
SUT MNCRL AB 3-0 PS2 18 (SUTURE) ×3 IMPLANT
SUT PDS 3-0 CT2 (SUTURE)
SUT PDS II 3-0 CT2 27 ABS (SUTURE) IMPLANT
SUT VIC AB 2-0 CT1 27 (SUTURE) ×3
SUT VIC AB 2-0 CT1 TAPERPNT 27 (SUTURE) ×1 IMPLANT
SUT VIC AB 2-0 SH 27 (SUTURE)
SUT VIC AB 2-0 SH 27XBRD (SUTURE) IMPLANT
SUT VIC AB 3-0 SH 27 (SUTURE)
SUT VIC AB 3-0 SH 27X BRD (SUTURE) IMPLANT
SUT VICRYL 0 SH 27 (SUTURE) IMPLANT
SYR BULB IRRIG 60ML STRL (SYRINGE) ×3 IMPLANT
SYR CONTROL 10ML LL (SYRINGE) IMPLANT
TOWEL GREEN STERILE FF (TOWEL DISPOSABLE) ×6 IMPLANT
TUBE CONNECTING 20X1/4 (TUBING) ×3 IMPLANT
UNDERPAD 30X36 HEAVY ABSORB (UNDERPADS AND DIAPERS) ×3 IMPLANT

## 2023-04-18 NOTE — Anesthesia Postprocedure Evaluation (Signed)
Anesthesia Post Note  Patient: Pamela Irwin  Procedure(s) Performed: OPEN REDUCTION INTERNAL FIXATION (ORIF) bimalleolar ANKLE FRACTURE (Right: Ankle) CLOSED REDUCTION INTERNAL FIXATION (ORIF) CALCANEUS (Right: Foot)     Patient location during evaluation: PACU Anesthesia Type: Regional and General Level of consciousness: awake Pain management: pain level controlled Vital Signs Assessment: post-procedure vital signs reviewed and stable Respiratory status: spontaneous breathing, nonlabored ventilation and respiratory function stable Cardiovascular status: blood pressure returned to baseline and stable Postop Assessment: no apparent nausea or vomiting Anesthetic complications: no   No notable events documented.  Last Vitals:  Vitals:   04/18/23 1345 04/18/23 1353  BP: 122/78 119/69  Pulse: 80 80  Resp: 19 18  Temp:  (!) 36.3 C  SpO2: 100% 97%    Last Pain:  Vitals:   04/18/23 1353  TempSrc:   PainSc: 0-No pain                 Jayah Balthazar P Saniyya Gau

## 2023-04-18 NOTE — Anesthesia Procedure Notes (Signed)
Anesthesia Regional Block: Popliteal block   Pre-Anesthetic Checklist: , timeout performed,  Correct Patient, Correct Site, Correct Laterality,  Correct Procedure, Correct Position, site marked,  Risks and benefits discussed,  Surgical consent,  Pre-op evaluation,  At surgeon's request and post-op pain management  Laterality: Right  Prep: chloraprep       Needles:  Injection technique: Single-shot  Needle Type: Echogenic Stimulator Needle     Needle Length: 10cm  Needle Gauge: 20     Additional Needles:   Procedures:,,,, ultrasound used (permanent image in chart),,    Narrative:  Start time: 04/18/2023 10:50 AM End time: 04/18/2023 11:00 AM Injection made incrementally with aspirations every 5 mL.  Performed by: Personally  Anesthesiologist: Leonides Grills, MD  Additional Notes: Functioning IV was confirmed and monitors were applied.  A timeout was performed. Sterile prep, hand hygiene and sterile gloves were used. A 20ga Bbraun echogenic stimulator needle was used. Negative aspiration and negative test dose prior to incremental administration of local anesthetic. The patient tolerated the procedure well.  Ultrasound guidance: relevent anatomy identified, needle position confirmed, local anesthetic spread visualized around nerve(s), vascular puncture avoided.  Image printed for medical record.

## 2023-04-18 NOTE — Progress Notes (Signed)
Assisted Dr. Roanna Banning with right, adductor canal and popliteal, ultrasound guided block. Side rails up, monitors on throughout procedure. See vital signs in flow sheet. Tolerated Procedure well.

## 2023-04-18 NOTE — Anesthesia Preprocedure Evaluation (Signed)
Anesthesia Evaluation  Patient identified by MRN, date of birth, ID band Patient awake    Reviewed: Allergy & Precautions, NPO status , Patient's Chart, lab work & pertinent test results  Airway Mallampati: II  TM Distance: >3 FB Neck ROM: Full    Dental no notable dental hx.    Pulmonary neg pulmonary ROS   Pulmonary exam normal        Cardiovascular negative cardio ROS Normal cardiovascular exam     Neuro/Psych negative neurological ROS  negative psych ROS   GI/Hepatic negative GI ROS, Neg liver ROS,,,  Endo/Other  negative endocrine ROS    Renal/GU negative Renal ROS     Musculoskeletal negative musculoskeletal ROS (+)    Abdominal   Peds  Hematology  (+) Blood dyscrasia, anemia   Anesthesia Other Findings Closed bimalleolar fracture of right ankle  Reproductive/Obstetrics Hcg negative                             Anesthesia Physical Anesthesia Plan  ASA: 1  Anesthesia Plan: General and Regional   Post-op Pain Management: Regional block*   Induction: Intravenous  PONV Risk Score and Plan: 3 and Ondansetron, Dexamethasone, Midazolam and Treatment may vary due to age or medical condition  Airway Management Planned: LMA  Additional Equipment:   Intra-op Plan:   Post-operative Plan: Extubation in OR  Informed Consent: I have reviewed the patients History and Physical, chart, labs and discussed the procedure including the risks, benefits and alternatives for the proposed anesthesia with the patient or authorized representative who has indicated his/her understanding and acceptance.     Dental advisory given  Plan Discussed with: CRNA  Anesthesia Plan Comments:        Anesthesia Quick Evaluation

## 2023-04-18 NOTE — Anesthesia Procedure Notes (Signed)
Anesthesia Regional Block: Adductor canal block   Pre-Anesthetic Checklist: , timeout performed,  Correct Patient, Correct Site, Correct Laterality,  Correct Procedure,, site marked,  Risks and benefits discussed,  Surgical consent,  Pre-op evaluation,  At surgeon's request and post-op pain management  Laterality: Right  Prep: chloraprep       Needles:  Injection technique: Single-shot  Needle Type: Echogenic Stimulator Needle     Needle Length: 10cm  Needle Gauge: 20     Additional Needles:   Procedures:,,,, ultrasound used (permanent image in chart),,    Narrative:  Start time: 04/18/2023 11:00 AM End time: 04/18/2023 11:10 AM Injection made incrementally with aspirations every 5 mL.  Performed by: Personally  Anesthesiologist: Leonides Grills, MD  Additional Notes: Functioning IV was confirmed and monitors were applied. A time-out was performed. Hand hygiene and sterile gloves were used. The thigh was placed in a frog-leg position and prepped in a sterile fashion. A 20ga Bbraun echogenic stimulator needle was placed using ultrasound guidance.  Negative aspiration and negative test dose prior to incremental administration of local anesthetic. The patient tolerated the procedure well.

## 2023-04-18 NOTE — Transfer of Care (Signed)
Immediate Anesthesia Transfer of Care Note  Patient: Pamela Irwin  Procedure(s) Performed: OPEN REDUCTION INTERNAL FIXATION (ORIF) bimalleolar ANKLE FRACTURE (Right: Ankle) CLOSED REDUCTION INTERNAL FIXATION (ORIF) CALCANEUS (Right: Foot)  Patient Location: PACU  Anesthesia Type:General and Regional  Level of Consciousness: awake  Airway & Oxygen Therapy: Patient Spontanous Breathing and Patient connected to face mask oxygen  Post-op Assessment: Report given to RN and Post -op Vital signs reviewed and stable  Post vital signs: Reviewed and stable  Last Vitals:  Vitals Value Taken Time  BP 115/70 04/18/23 1318  Temp    Pulse 77 04/18/23 1319  Resp 13 04/18/23 1319  SpO2 100 % 04/18/23 1319  Vitals shown include unfiled device data.  Last Pain:  Vitals:   04/18/23 0848  TempSrc: Oral  PainSc: 0-No pain      Patients Stated Pain Goal: 5 (04/18/23 0848)  Complications: No notable events documented.

## 2023-04-18 NOTE — H&P (Signed)

## 2023-04-18 NOTE — Anesthesia Procedure Notes (Signed)
Procedure Name: LMA Insertion Date/Time: 04/18/2023 12:05 PM  Performed by: Caren Macadam, CRNAPre-anesthesia Checklist: Patient identified, Emergency Drugs available, Suction available and Patient being monitored Patient Re-evaluated:Patient Re-evaluated prior to induction Oxygen Delivery Method: Circle system utilized Preoxygenation: Pre-oxygenation with 100% oxygen Induction Type: IV induction Ventilation: Mask ventilation without difficulty LMA: LMA inserted LMA Size: 4.0 Number of attempts: 1 Placement Confirmation: positive ETCO2 and breath sounds checked- equal and bilateral Tube secured with: Tape Dental Injury: Teeth and Oropharynx as per pre-operative assessment

## 2023-04-19 NOTE — Op Note (Addendum)
04/18/2023  10:53 PM   PATIENT: Pamela Irwin  22 y.o. female  MRN: [id number]   PRE-OPERATIVE DIAGNOSIS:   Closed pilon fracture of right ankle; Closed minimally displaced fracture of right calcaneus    POST-OPERATIVE DIAGNOSIS:   Same   PROCEDURE: 1] Open reduction internal fixation of right distal tibia pilon without fixation of fibula 2] Closed reduction with manipulation of right calcaneus fracture 3] Closed reduction with manipulation of right fibula fracture   SURGEON:  Netta Cedars, MD   ASSISTANT: None   ANESTHESIA: General, regional   EBL: Minimal   TOURNIQUET:   90 min   COMPLICATIONS: None apparent   DISPOSITION: Extubated, awake and stable to recovery.   INDICATION FOR PROCEDURE: The patient presented with above diagnosis.  We discussed the diagnosis, alternative treatment options, risks and benefits of the above surgical intervention, as well as alternative non-operative treatments. All questions/concerns were addressed and the patient/family demonstrated appropriate understanding of the diagnosis, the procedure, the postoperative course, and overall prognosis. The patient wished to proceed with surgical intervention and signed an informed surgical consent as such, in each others presence prior to surgery.   PROCEDURE IN DETAIL: After preoperative consent was obtained and the correct operative site was identified, the patient was brought to the operating room supine on stretcher and transferred onto operating table. General anesthesia was induced. Preoperative antibiotics were administered. Surgical timeout was taken. The patient was then positioned supine with an ipsilateral hip bump. The operative lower extremity was prepped and draped in standard sterile fashion with a tourniquet around the thigh. The extremity was exsanguinated and the tourniquet was inflated to 275 mmHg.  We then made a direct medial ankle approach and extended this  proximally in anticipation of implanting a hook plate. Dissection was carried down to the level of the medial malleolar fragment. A dental pick and freer elevator were used to reduce the medial malleolar fragment. Of note, there was extensive comminution of this fragment into multiple segments, all of which had very poor bone quality. A Paragon28 medial distal tibia hook plate was utilized to fix the reduced medial malleolar fragment and the tines were carefully inserted into the distal tip of the malleolus. The plate was oriented to best capture the major fragments of the medial comminution. We placed a non-locking screw in the hook plate and subsequently implanted further locking screws proximally and distally to further secure the plate.  Closed reduction with manipulation of the right distal fibula was performed to restore normal anatomy as verified by intraoperative fluoroscopy.  Closed reduction with manipulation of the right calcaneus was performed to restore normal anatomy as verified by intraoperative fluoroscopy.  The surgical sites were thoroughly irrigated. The tourniquet was deflated and hemostasis achieved. Betadine and vancomycin powder were applied. The deep layers were closed using 2-0 vicryl. The skin was closed without tension using 2-0 nylon suture and staples.    The leg was cleaned with saline and sterile dressings with gauze were applied. A well padded bulky short leg splint was applied. The patient was awakened from anesthesia and transported to the recovery room in stable condition.    FOLLOW UP PLAN: -transfer to PACU, then home -strict NWB operative extremity, maximum elevation -maintain short leg splint until follow up -DVT ppx: Aspirin 81 mg twice daily while NWB -follow up as outpatient within 7-10 days for wound check with exchange of short leg splint to short leg cast -sutures out in 2-3 weeks in outpatient office  RADIOGRAPHS: 1] Right Ankle AP, lateral,  oblique and stress radiographs of the operative limb were obtained intraoperatively. These showed interval reduction and fixation of the fractures. Manual stress radiographs were taken and the ankle mortise and tibiofibular relationship were noted to be stable following fixation. All hardware is appropriately positioned and of the appropriate lengths. No other acute injuries are noted.  2] Right Heel Lateral and Harris heel radiographs of the operative limb were obtained intraoperatively. These showed interval reduction of the calcaneus fracture. All ankle hardware is appropriately positioned and of the appropriate lengths. No other acute injuries are noted.  Netta Cedars [facility]

## 2023-04-24 ENCOUNTER — Encounter (HOSPITAL_BASED_OUTPATIENT_CLINIC_OR_DEPARTMENT_OTHER): Payer: Self-pay | Admitting: Orthopaedic Surgery

## 2023-04-25 DIAGNOSIS — S82891K Other fracture of right lower leg, subsequent encounter for closed fracture with nonunion: Secondary | ICD-10-CM

## 2023-04-30 ENCOUNTER — Inpatient Hospital Stay: Payer: Medicaid Other | Admitting: Family Medicine

## 2023-05-11 DIAGNOSIS — S82841A Displaced bimalleolar fracture of right lower leg, initial encounter for closed fracture: Secondary | ICD-10-CM | POA: Diagnosis not present

## 2023-05-11 DIAGNOSIS — S92001A Unspecified fracture of right calcaneus, initial encounter for closed fracture: Secondary | ICD-10-CM | POA: Diagnosis not present

## 2023-06-01 DIAGNOSIS — Z4889 Encounter for other specified surgical aftercare: Secondary | ICD-10-CM | POA: Diagnosis not present

## 2023-06-01 DIAGNOSIS — S82841A Displaced bimalleolar fracture of right lower leg, initial encounter for closed fracture: Secondary | ICD-10-CM | POA: Diagnosis not present

## 2023-06-01 DIAGNOSIS — S92001A Unspecified fracture of right calcaneus, initial encounter for closed fracture: Secondary | ICD-10-CM | POA: Diagnosis not present

## 2023-06-11 ENCOUNTER — Other Ambulatory Visit (HOSPITAL_COMMUNITY): Payer: Self-pay

## 2023-06-18 DIAGNOSIS — N6320 Unspecified lump in the left breast, unspecified quadrant: Secondary | ICD-10-CM | POA: Diagnosis not present

## 2023-06-18 DIAGNOSIS — Z7251 High risk heterosexual behavior: Secondary | ICD-10-CM | POA: Diagnosis not present

## 2023-06-18 DIAGNOSIS — Z1159 Encounter for screening for other viral diseases: Secondary | ICD-10-CM | POA: Diagnosis not present

## 2023-06-18 DIAGNOSIS — R0602 Shortness of breath: Secondary | ICD-10-CM | POA: Diagnosis not present

## 2023-06-18 DIAGNOSIS — Z5320 Procedure and treatment not carried out because of patient's decision for unspecified reasons: Secondary | ICD-10-CM | POA: Diagnosis not present

## 2023-06-18 DIAGNOSIS — Z0000 Encounter for general adult medical examination without abnormal findings: Secondary | ICD-10-CM | POA: Diagnosis not present

## 2023-06-18 DIAGNOSIS — Z13220 Encounter for screening for lipoid disorders: Secondary | ICD-10-CM | POA: Diagnosis not present

## 2023-06-18 DIAGNOSIS — Z1339 Encounter for screening examination for other mental health and behavioral disorders: Secondary | ICD-10-CM | POA: Diagnosis not present

## 2023-06-18 DIAGNOSIS — D539 Nutritional anemia, unspecified: Secondary | ICD-10-CM | POA: Diagnosis not present

## 2023-06-18 DIAGNOSIS — R87610 Atypical squamous cells of undetermined significance on cytologic smear of cervix (ASC-US): Secondary | ICD-10-CM | POA: Diagnosis not present

## 2023-06-18 DIAGNOSIS — Z6821 Body mass index (BMI) 21.0-21.9, adult: Secondary | ICD-10-CM | POA: Diagnosis not present

## 2023-06-18 DIAGNOSIS — Z1331 Encounter for screening for depression: Secondary | ICD-10-CM | POA: Diagnosis not present

## 2023-06-22 DIAGNOSIS — S92001A Unspecified fracture of right calcaneus, initial encounter for closed fracture: Secondary | ICD-10-CM | POA: Diagnosis not present

## 2023-06-22 DIAGNOSIS — Z4889 Encounter for other specified surgical aftercare: Secondary | ICD-10-CM | POA: Diagnosis not present

## 2023-06-22 DIAGNOSIS — S82841A Displaced bimalleolar fracture of right lower leg, initial encounter for closed fracture: Secondary | ICD-10-CM | POA: Diagnosis not present

## 2023-08-06 DIAGNOSIS — M25571 Pain in right ankle and joints of right foot: Secondary | ICD-10-CM | POA: Diagnosis not present

## 2023-08-06 DIAGNOSIS — M25671 Stiffness of right ankle, not elsewhere classified: Secondary | ICD-10-CM | POA: Diagnosis not present

## 2023-08-06 DIAGNOSIS — M6281 Muscle weakness (generalized): Secondary | ICD-10-CM | POA: Diagnosis not present

## 2023-08-14 DIAGNOSIS — M25671 Stiffness of right ankle, not elsewhere classified: Secondary | ICD-10-CM | POA: Diagnosis not present

## 2023-08-14 DIAGNOSIS — M6281 Muscle weakness (generalized): Secondary | ICD-10-CM | POA: Diagnosis not present

## 2023-08-14 DIAGNOSIS — M25571 Pain in right ankle and joints of right foot: Secondary | ICD-10-CM | POA: Diagnosis not present

## 2023-08-27 DIAGNOSIS — M6281 Muscle weakness (generalized): Secondary | ICD-10-CM | POA: Diagnosis not present

## 2023-08-27 DIAGNOSIS — M25671 Stiffness of right ankle, not elsewhere classified: Secondary | ICD-10-CM | POA: Diagnosis not present

## 2023-08-27 DIAGNOSIS — M25571 Pain in right ankle and joints of right foot: Secondary | ICD-10-CM | POA: Diagnosis not present
